# Patient Record
Sex: Male | Born: 1970 | Race: Black or African American | Hispanic: No | Marital: Single | Smoking: Former smoker
Health system: Southern US, Community
[De-identification: ages and names within clinical notes are randomized; demographics above are authoritative.]

---

## 2005-12-29 ENCOUNTER — Emergency Department (HOSPITAL_COMMUNITY): Admission: EM | Admit: 2005-12-29 | Discharge: 2005-12-29 | Payer: Self-pay | Admitting: *Deleted

## 2011-02-18 ENCOUNTER — Emergency Department (HOSPITAL_COMMUNITY)
Admission: EM | Admit: 2011-02-18 | Discharge: 2011-02-18 | Disposition: A | Discharge status: Home / Self Care | Payer: No Typology Code available for payment source | Attending: Emergency Medicine | Admitting: Emergency Medicine

## 2011-02-18 ENCOUNTER — Emergency Department (HOSPITAL_COMMUNITY): Payer: No Typology Code available for payment source

## 2011-02-18 DIAGNOSIS — S8000XA Contusion of unspecified knee, initial encounter: Secondary | ICD-10-CM | POA: Insufficient documentation

## 2011-02-18 DIAGNOSIS — M62838 Other muscle spasm: Secondary | ICD-10-CM | POA: Insufficient documentation

## 2011-02-18 DIAGNOSIS — F17200 Nicotine dependence, unspecified, uncomplicated: Secondary | ICD-10-CM | POA: Insufficient documentation

## 2011-02-18 DIAGNOSIS — M25569 Pain in unspecified knee: Secondary | ICD-10-CM | POA: Insufficient documentation

## 2011-02-18 DIAGNOSIS — S139XXA Sprain of joints and ligaments of unspecified parts of neck, initial encounter: Secondary | ICD-10-CM | POA: Insufficient documentation

## 2011-02-18 DIAGNOSIS — R209 Unspecified disturbances of skin sensation: Secondary | ICD-10-CM | POA: Insufficient documentation

## 2011-02-18 DIAGNOSIS — M542 Cervicalgia: Secondary | ICD-10-CM | POA: Insufficient documentation
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
## 2012-01-05 ENCOUNTER — Ambulatory Visit: Payer: Self-pay | Admitting: Physician Assistant

## 2012-01-05 VITALS — BP 120/78 | HR 61 | Temp 98.7°F | Resp 16 | Ht 73.0 in | Wt 185.0 lb

## 2012-01-05 DIAGNOSIS — Z0289 Encounter for other administrative examinations: Secondary | ICD-10-CM
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
## 2013-10-08 ENCOUNTER — Emergency Department (HOSPITAL_COMMUNITY)
Admission: EM | Admit: 2013-10-08 | Discharge: 2013-10-08 | Disposition: A | Discharge status: Home / Self Care | Payer: 59 | Attending: Emergency Medicine | Admitting: Emergency Medicine

## 2013-10-08 ENCOUNTER — Emergency Department (HOSPITAL_COMMUNITY): Payer: 59

## 2013-10-08 ENCOUNTER — Encounter (HOSPITAL_COMMUNITY): Payer: Self-pay | Admitting: Emergency Medicine

## 2013-10-08 DIAGNOSIS — G8911 Acute pain due to trauma: Secondary | ICD-10-CM | POA: Insufficient documentation

## 2013-10-08 DIAGNOSIS — M25462 Effusion, left knee: Secondary | ICD-10-CM

## 2013-10-08 DIAGNOSIS — M25562 Pain in left knee: Secondary | ICD-10-CM

## 2013-10-08 DIAGNOSIS — M25469 Effusion, unspecified knee: Secondary | ICD-10-CM | POA: Insufficient documentation

## 2013-10-08 DIAGNOSIS — M25569 Pain in unspecified knee: Secondary | ICD-10-CM | POA: Insufficient documentation

## 2013-10-08 DIAGNOSIS — Z87891 Personal history of nicotine dependence: Secondary | ICD-10-CM | POA: Insufficient documentation

## 2013-10-08 IMAGING — CR DG KNEE COMPLETE 4+V*L*
4 series · 4 of 4 positions shown · non-contrast
Comparison: None.

CLINICAL DATA: Knee injury and pain.

EXAM:
LEFT KNEE - COMPLETE 4+ VIEW

[t knee ap left]
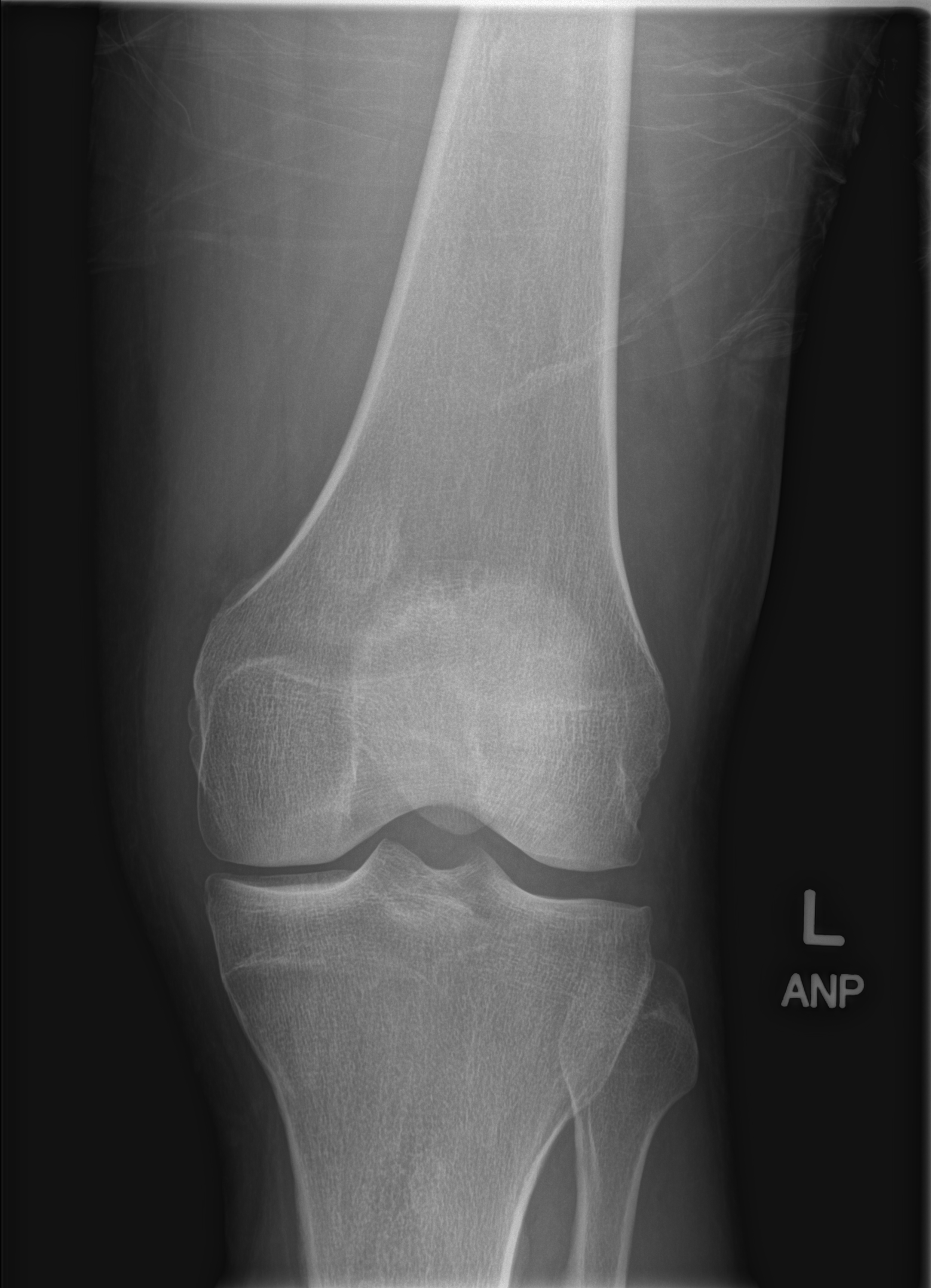

[t knee obl left (1 of 2)]
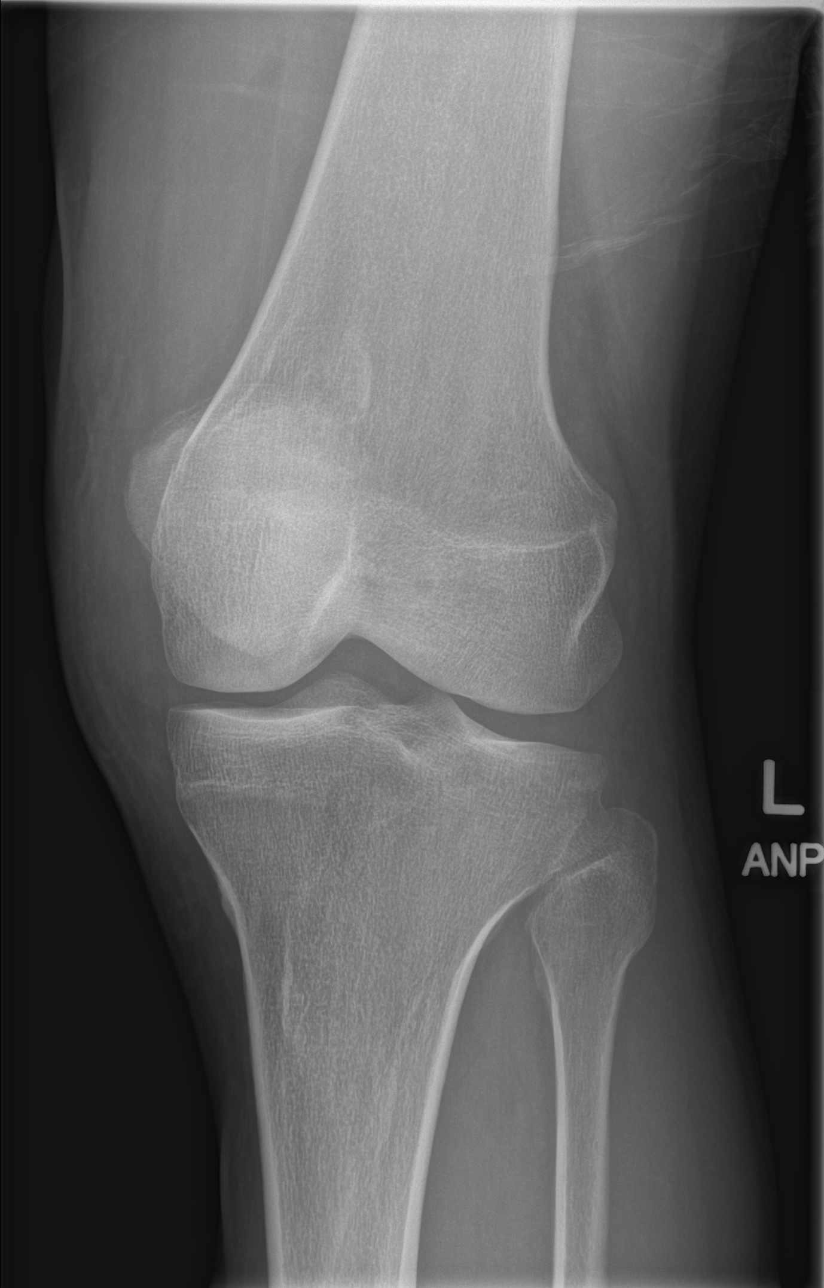

[t knee obl left (2 of 2)]
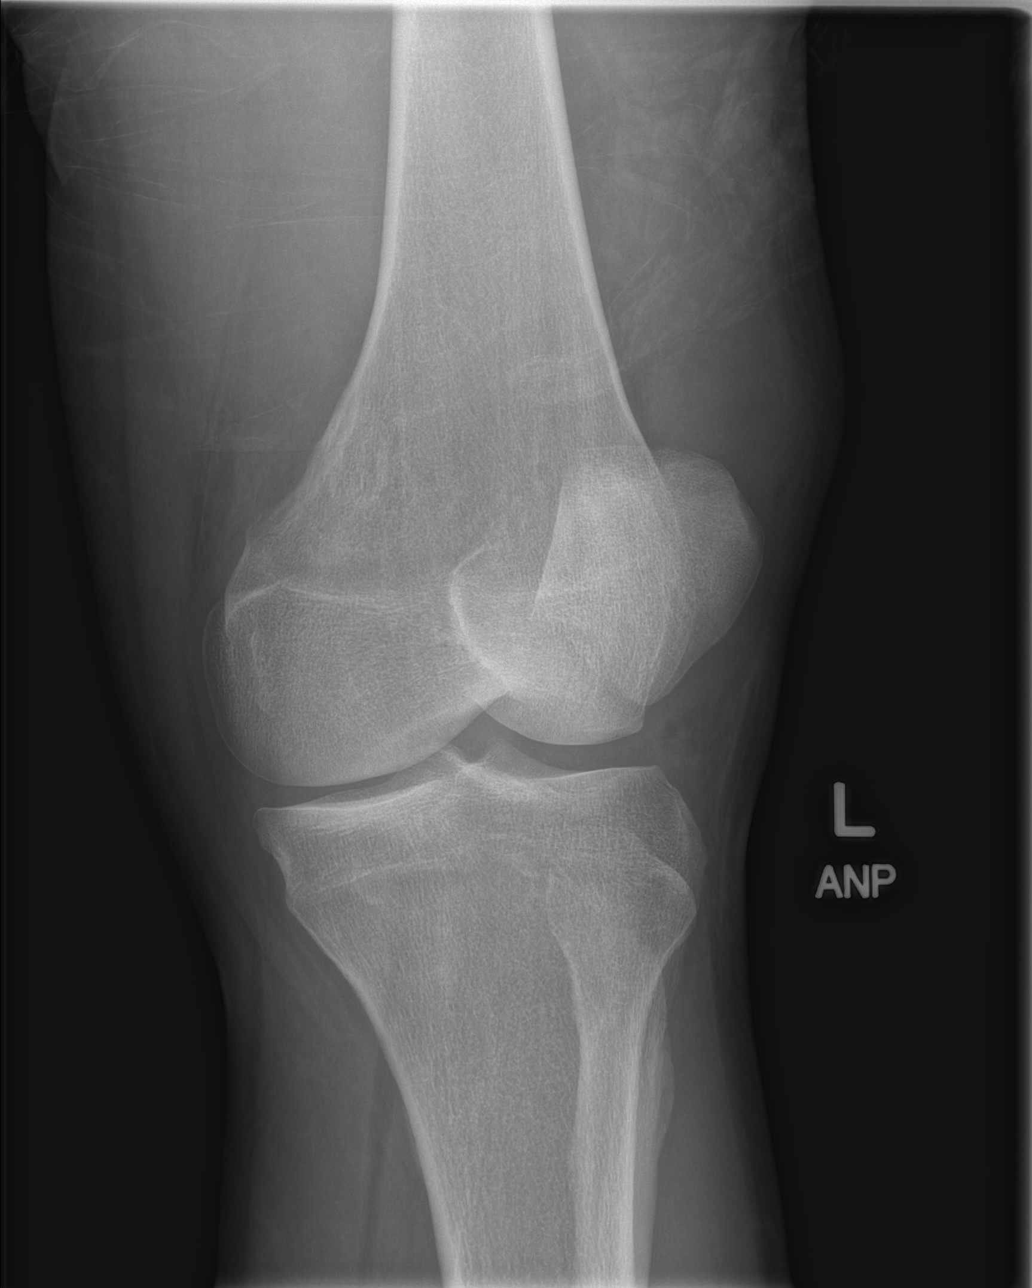

[t knee lat left]
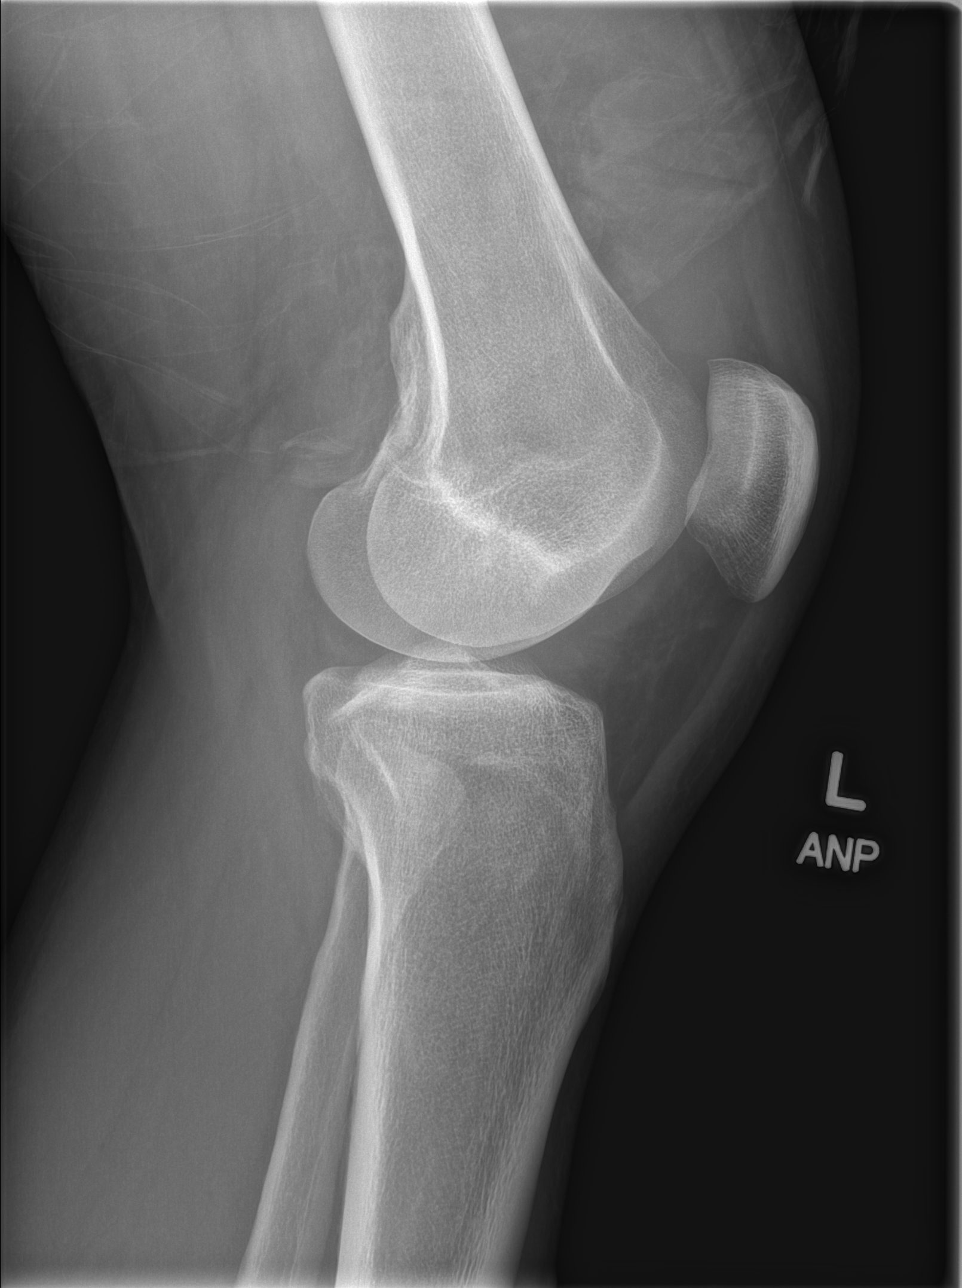

[4 of 4 positions shown; findings below may reference images not displayed]

FINDINGS: A moderate knee joint effusion is seen. No evidence of
lipohemarthrosis. No evidence of fracture or dislocation. No other
significant bone abnormality identified.
IMPRESSION: Moderate knee joint effusion.  No evidence of fracture.

## 2013-10-08 MED ORDER — NAPROXEN 500 MG PO TABS
500.0000 mg | ORAL_TABLET | Freq: Once | ORAL | Status: AC
  Administered 2013-10-08: 500 mg via ORAL
  Filled 2013-10-08: qty 1

## 2013-10-08 MED ORDER — NAPROXEN 500 MG PO TABS: 500.0000 mg | ORAL_TABLET | Freq: Two times a day (BID) | ORAL | Status: DC

## 2013-10-08 MED ORDER — HYDROCODONE-ACETAMINOPHEN 5-325 MG PO TABS: 1.0000 | ORAL_TABLET | ORAL | Status: DC | PRN
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
## 2013-10-08 NOTE — Discharge Instructions (Signed)
We are a knee brace for compression. Use crutches when walking to prevent from bearing weight on her left leg. Recommend Naprosyn as prescribed and Norco as needed for severe pain. Followup with orthopedics. Return if symptoms worsen such as if you develop redness of your knee, and numbness to your leg, fever, or red streaking from your knee up or down your left leg.  RICE: Routine Care for Injuries The routine care of many injuries includes Rest, Ice, Compression, and Elevation (RICE). HOME CARE INSTRUCTIONS  Rest is needed to allow your body to heal. Routine activities can usually be resumed when comfortable. Injured tendons and bones can take up to 6 weeks to heal. Tendons are the cord-like structures that attach muscle to bone.  Ice following an injury helps keep the swelling down and reduces pain.  Put ice in a plastic bag.  Place a towel between your skin and the bag.  Leave the ice on for 15-20 minutes, 03-04 times a day. Do this while awake, for the first 24 to 48 hours. After that, continue as directed by your caregiver.  Compression helps keep swelling down. It also gives support and helps with discomfort. If an elastic bandage has been applied, it should be removed and reapplied every 3 to 4 hours. It should not be applied tightly, but firmly enough to keep swelling down. Watch fingers or toes for swelling, bluish discoloration, coldness, numbness, or excessive pain. If any of these problems occur, remove the bandage and reapply loosely. Contact your caregiver if these problems continue.  Elevation helps reduce swelling and decreases pain. With extremities, such as the arms, hands, legs, and feet, the injured area should be placed near or above the level of the heart, if possible. SEEK IMMEDIATE MEDICAL CARE IF:  You have persistent pain and swelling.  You develop redness, numbness, or unexpected weakness.  Your symptoms are getting worse rather than improving after several  days. These symptoms may indicate that further evaluation or further X-rays are needed. Sometimes, X-rays may not show a small broken bone (fracture) until 1 week or 10 days later. Make a follow-up appointment with your caregiver. Ask when your X-ray results will be ready. Make sure you get your X-ray results. Document Released: 09/21/2000 Document Revised: 09/01/2011 Document Reviewed: 11/08/2010 [facility] Patient Information 2014 [employer], [place].

## 2013-10-08 NOTE — ED Notes (Signed)
Pt states he sprained his L knee on Easter Sunday. States he has taken anti inflammatories and used ice. States now knee is still swollen and he has more pain in it. Pt arrives with knee brace on. Pt ambulatory to exam room with steady gait.

## 2013-10-09 NOTE — ED Provider Notes (Signed)
Medical screening examination/treatment/procedure(s) were performed by non-physician practitioner and as supervising physician I was immediately available for consultation/collaboration.   EKG Interpretation None        Shelda JakesScott W. Jazmaine Fuelling, MD 10/09/13 [phone]

## 2020-06-08 ENCOUNTER — Encounter (HOSPITAL_COMMUNITY): Payer: Self-pay | Admitting: Emergency Medicine

## 2020-06-08 ENCOUNTER — Emergency Department (HOSPITAL_COMMUNITY): Payer: No Typology Code available for payment source

## 2020-06-08 ENCOUNTER — Other Ambulatory Visit: Payer: Self-pay

## 2020-06-08 ENCOUNTER — Emergency Department (HOSPITAL_COMMUNITY)
Admission: EM | Admit: 2020-06-08 | Discharge: 2020-06-09 | Disposition: A | Discharge status: Home / Self Care | Payer: No Typology Code available for payment source | Attending: Emergency Medicine | Admitting: Emergency Medicine

## 2020-06-08 DIAGNOSIS — Z87891 Personal history of nicotine dependence: Secondary | ICD-10-CM | POA: Insufficient documentation

## 2020-06-08 DIAGNOSIS — Y990 Civilian activity done for income or pay: Secondary | ICD-10-CM | POA: Insufficient documentation

## 2020-06-08 DIAGNOSIS — S161XXA Strain of muscle, fascia and tendon at neck level, initial encounter: Secondary | ICD-10-CM | POA: Diagnosis not present

## 2020-06-08 DIAGNOSIS — Y92410 Unspecified street and highway as the place of occurrence of the external cause: Secondary | ICD-10-CM | POA: Insufficient documentation

## 2020-06-08 DIAGNOSIS — S199XXA Unspecified injury of neck, initial encounter: Secondary | ICD-10-CM | POA: Diagnosis present
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
## 2020-06-09 ENCOUNTER — Emergency Department (HOSPITAL_COMMUNITY): Payer: No Typology Code available for payment source
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
## 2020-06-09 MED ORDER — CYCLOBENZAPRINE HCL 10 MG PO TABS: 10.0000 mg | ORAL_TABLET | Freq: Three times a day (TID) | ORAL | 0 refills | Status: DC | PRN

## 2020-06-09 MED ORDER — NAPROXEN 500 MG PO TABS: 500.0000 mg | ORAL_TABLET | Freq: Two times a day (BID) | ORAL | 0 refills | Status: DC

## 2020-07-04 ENCOUNTER — Other Ambulatory Visit: Payer: Self-pay

## 2020-07-04 ENCOUNTER — Emergency Department (HOSPITAL_COMMUNITY)
Admission: EM | Admit: 2020-07-04 | Discharge: 2020-07-04 | Disposition: A | Discharge status: Home / Self Care | Payer: Self-pay | Attending: Emergency Medicine | Admitting: Emergency Medicine

## 2020-07-04 ENCOUNTER — Encounter (HOSPITAL_COMMUNITY): Payer: Self-pay

## 2020-07-04 DIAGNOSIS — Z87891 Personal history of nicotine dependence: Secondary | ICD-10-CM | POA: Insufficient documentation

## 2020-07-04 DIAGNOSIS — M545 Low back pain, unspecified: Secondary | ICD-10-CM | POA: Insufficient documentation

## 2020-07-04 MED ORDER — CYCLOBENZAPRINE HCL 10 MG PO TABS: 10.0000 mg | ORAL_TABLET | Freq: Two times a day (BID) | ORAL | 0 refills | Status: DC | PRN

## 2020-07-04 NOTE — Discharge Instructions (Signed)
You came to the emergency department to be evaluated for your low back pain. Your physical exam was reassuring. At this time there is no need for further imaging however you should follow-up with orthopedics for further evaluation. I have provided information for [facility] orthopedic and sports medicine please contact them for an appointment.    Please read instructions below.  You can take 600 mg of ibuprofen every 6 hours as needed for pain.   Apply ice to your back for 20 minutes at a time.  You can also apply heat if this provides more relief.   You can take Flexeril/cyclobenzaprine every 12 hours as needed for muscle spasm.  Be aware this medication can make you drowsy; do not take while driving or drinking alcohol.   Follow-up with your primary care provider symptoms persist.   Return to ER if new numbness or tingling in your arms or legs, inability to urinate, inability to hold your bowels, or weakness in your extremities.
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
## 2020-07-17 ENCOUNTER — Other Ambulatory Visit: Payer: Self-pay | Admitting: Orthopedic Surgery

## 2020-07-17 DIAGNOSIS — M542 Cervicalgia: Secondary | ICD-10-CM

## 2020-07-17 DIAGNOSIS — M545 Low back pain, unspecified: Secondary | ICD-10-CM

## 2020-08-04 ENCOUNTER — Ambulatory Visit
Admission: RE | Admit: 2020-08-04 | Discharge: 2020-08-04 | Disposition: A | Discharge status: Home / Self Care | Payer: Self-pay | Source: Ambulatory Visit | Attending: Orthopedic Surgery | Admitting: Orthopedic Surgery

## 2020-08-04 ENCOUNTER — Other Ambulatory Visit: Payer: Self-pay

## 2020-08-04 DIAGNOSIS — M542 Cervicalgia: Secondary | ICD-10-CM

## 2020-08-04 DIAGNOSIS — M545 Low back pain, unspecified: Secondary | ICD-10-CM

## 2020-08-04 IMAGING — MR MR CERVICAL SPINE W/O CM
5 series · 32 of 48 positions shown · non-contrast
Comparison: Radiographs of the cervical spine [DATE].

CLINICAL DATA: Cervicalgia. Additional history provided: Patient
reports right shoulder pain since [DATE]st, no known injury, no
prior surgeries.

EXAM:
MRI CERVICAL SPINE WITHOUT CONTRAST
TECHNIQUE: Multiplanar, multisequence MR imaging of the cervical spine was
performed. No intravenous contrast was administered.

[Series 5: T2 · sagittal · 3.0mm · 0.82mm/px · 7 of 15 slices shown (1 of 2)]
[im 1/15]
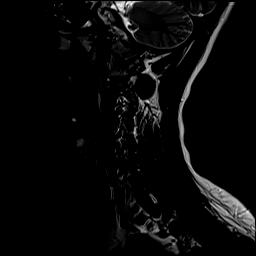
[im 3/15]
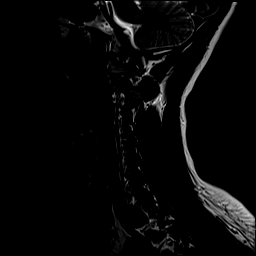
[im 5/15]
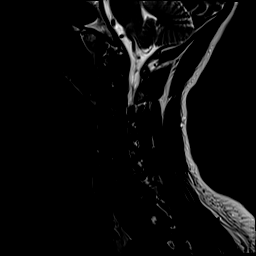
[im 8/15]
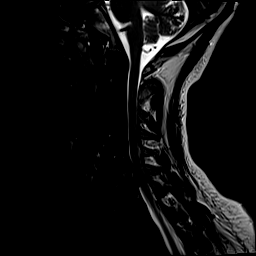
[im 10/15]
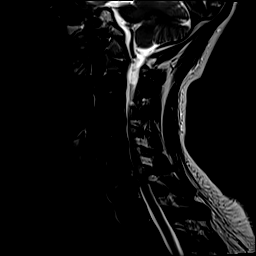
[im 12/15]
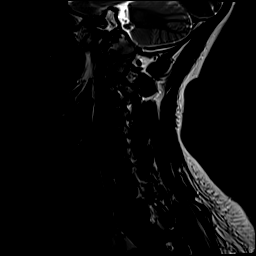
[im 15/15]
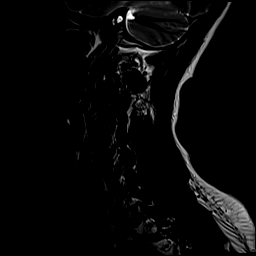

[Series 6: T1 · sagittal · 3.0mm · 0.82mm/px · 7 of 15 slices shown]
[im 1/15]
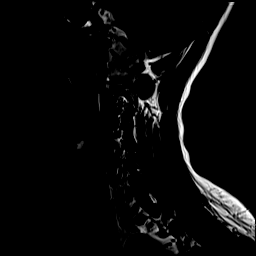
[im 3/15]
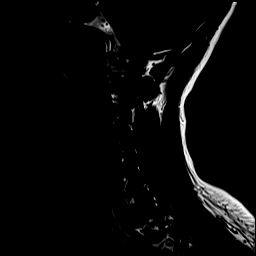
[im 5/15]
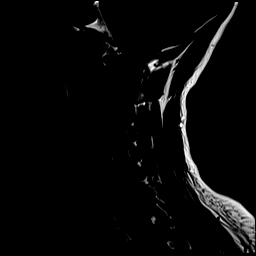
[im 8/15]
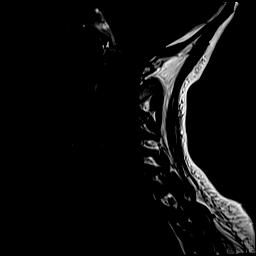
[im 10/15]
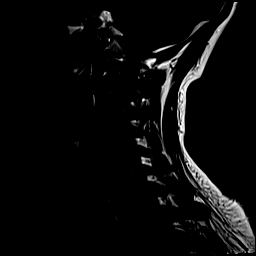
[im 12/15]
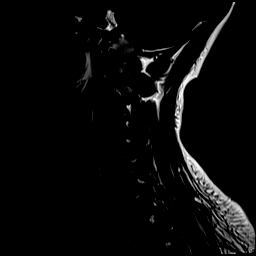
[im 15/15]
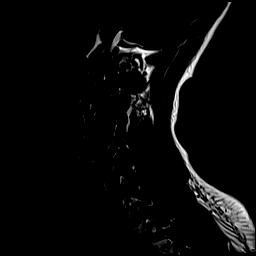

[Series 7: STIR · sagittal · 3.0mm · 0.33mm/px · 6 of 15 slices shown]
[im 1/15]
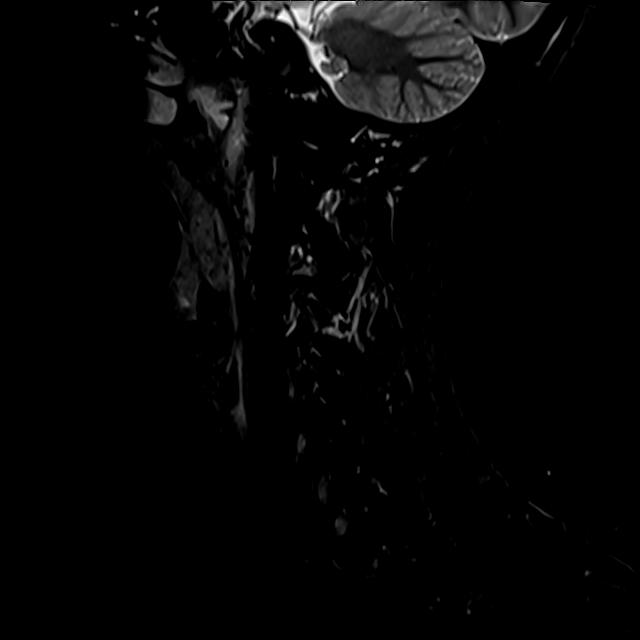
[im 3/15]
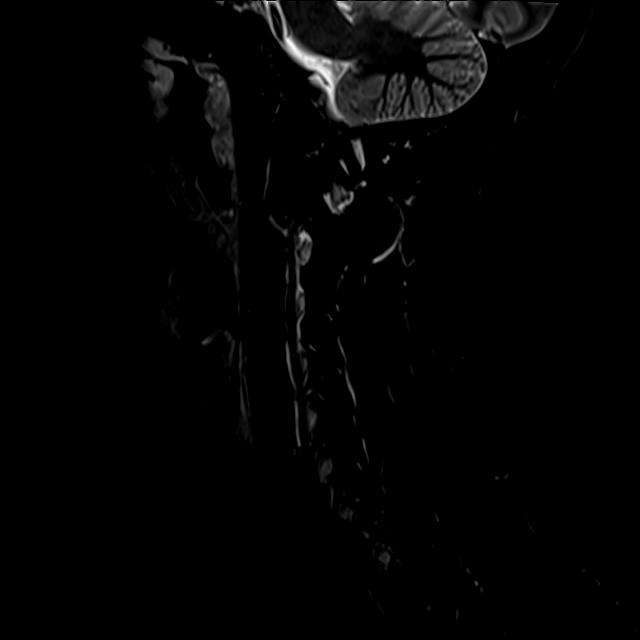
[im 6/15]
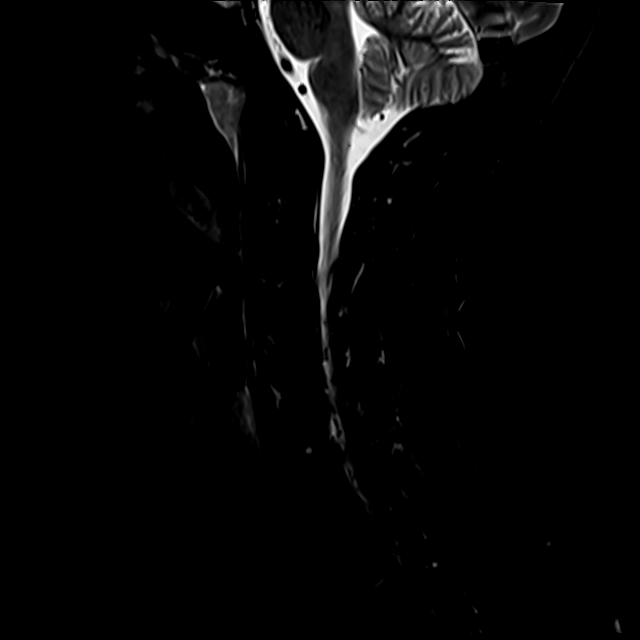
[im 9/15]
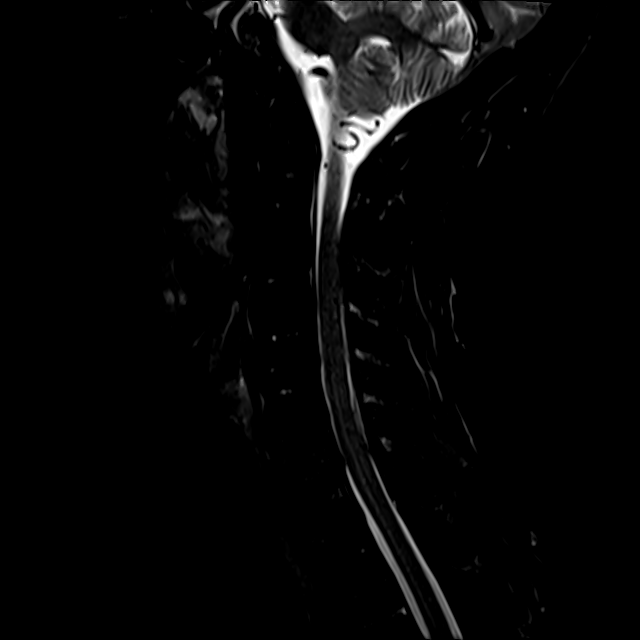
[im 12/15]
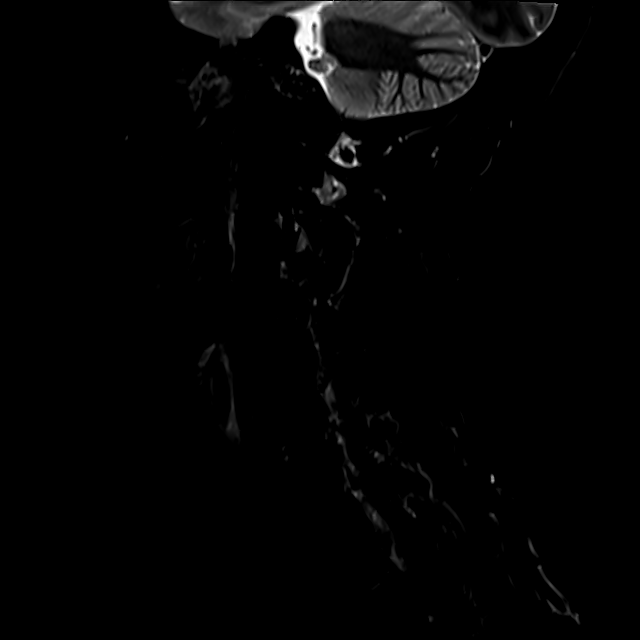
[im 15/15]
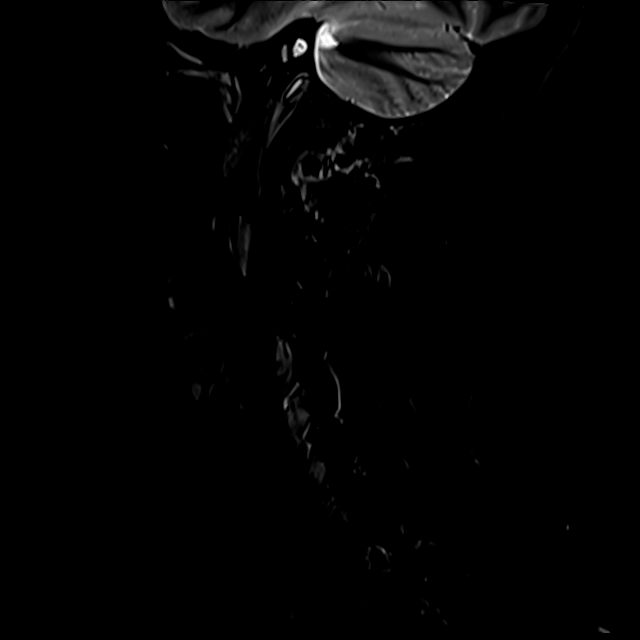

[Series 8: T2 · axial · 3.0mm · 0.62mm/px · z∈[-35,+72]mm · 8 of 34 slices shown (2 of 2)]
[im 1/34]
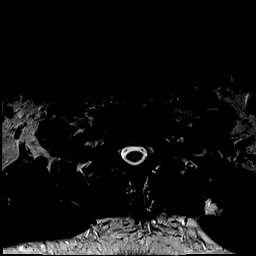
[im 6/34]
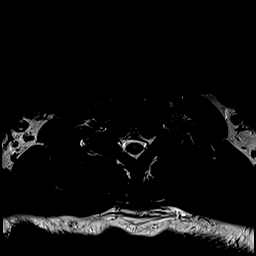
[im 11/34]
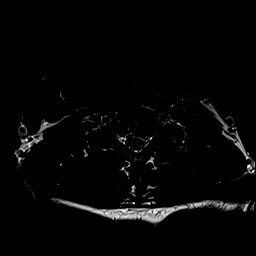
[im 16/34]
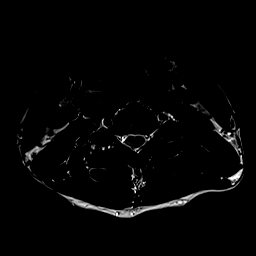
[im 18/34]
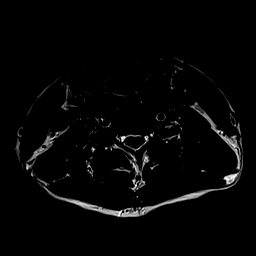
[im 23/34]
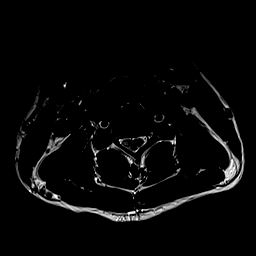
[im 28/34]
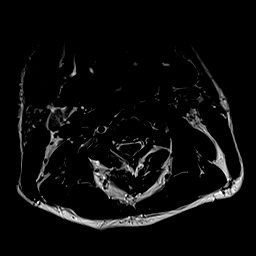
[im 34/34]
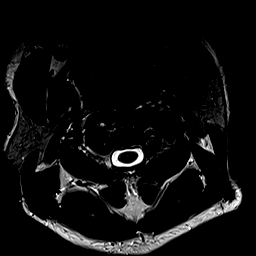

[Series 9: GRE · axial · 3.0mm · 0.42mm/px · z∈[-35,+14]mm · 4 of 34 slices shown]
[im 1/34]
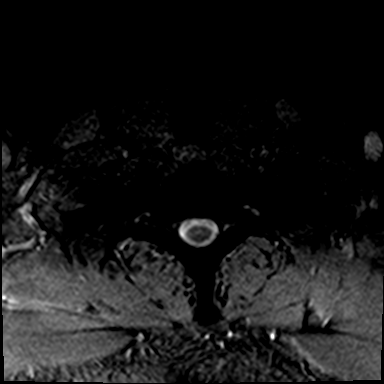
[im 6/34]
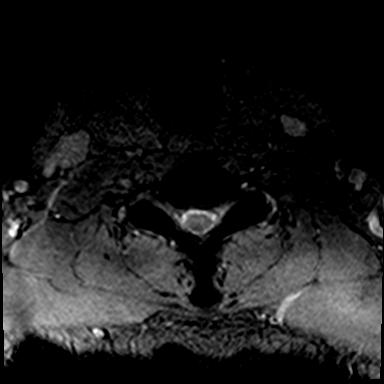
[im 11/34]
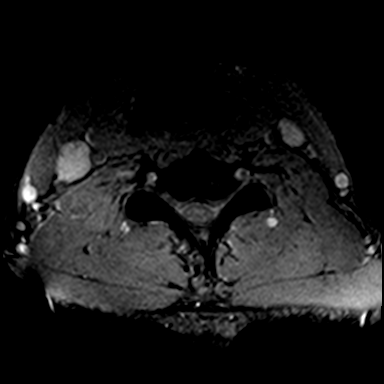
[im 16/34]
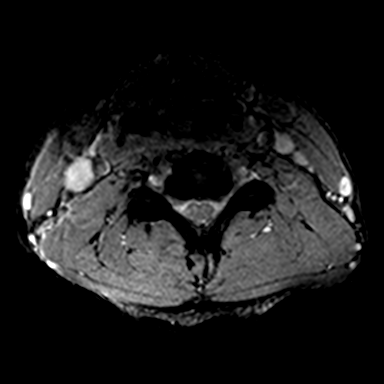

[32 of 48 positions shown; findings below may reference images not displayed]

FINDINGS: Alignment: The expected cervical lordosis. No significant
spondylolisthesis.

Vertebrae: Vertebral body height is maintained. Mild-to-moderate
edema within the C6 and C7 vertebral bodies, likely degenerative in
the absence of any signs or symptoms of infection.

Cord: Nonspecific 3 mm T2 hyperintense focus within the right aspect
of the spinal cord at the C4 level (series 8, image 12) (series 7,
image 7). No other cervical spinal cord signal abnormality is
identified.

Posterior Fossa, vertebral arteries, paraspinal tissues: . flow
voids preserved within the imaged cervical vertebral arteries.
Paraspinal soft tissues within normal limits.

Disc levels:

Mild/moderate C6-C7 disc degeneration. No more than mild disc
degeneration at the remaining levels.

C2-C3: Shallow disc bulge. Uncovertebral hypertrophy. No significant
degenerative canal stenosis. Mild right neural foraminal narrowing.

C3-C4: Shallow disc bulge. Uncovertebral hypertrophy. No significant
degenerative canal stenosis. Moderate/severe bilateral neural
foraminal narrowing.

C4-C5: Shallow disc bulge. Uncovertebral hypertrophy. Mild relative
spinal canal narrowing. Bilateral neural foraminal narrowing (severe
right, moderate left).

C5-C6: Shallow disc bulge. Uncovertebral hypertrophy. Mild relative
spinal canal narrowing. Moderate/severe bilateral neural foraminal
narrowing.

C6-C7: Disc bulge asymmetric to the left. Bilateral disc osteophyte
ridge/uncinate hypertrophy. Mild ligamentum flavum hypertrophy.
Moderate spinal canal stenosis with contact upon the dorsal and
ventral spinal cord. Bilateral neural foraminal narrowing
(moderate/severe right, severe left).

C7-T1: Shallow disc bulge. Endplate spurring. Mild facet
hypertrophy. No significant spinal canal stenosis. Mild bilateral
neural foraminal narrowing (greater on the left).

These results will be called to the ordering clinician or
representative by the Radiologist Assistant, and communication
documented in the PACS or [REDACTED].
IMPRESSION: Nonspecific 3 mm T2 hyperintense lesion within the right aspect of
the spinal cord at the C4 level. Postcontrast MR imaging of the
cervical spine is recommended for further characterization.

Mild to moderate edema within the C6 and C7 vertebrae, likely
degenerative in the absence of any signs or symptoms of infection.
Clinical correlation is recommended.

Cervical spondylosis superimposed upon a congenitally narrow
cervical spinal canal as outlined. Moderate C6-C7 spinal canal
stenosis with contact upon the dorsal and ventral spinal cord. No
more than mild acquired spinal canal stenosis at the remaining
levels.

Multilevel neural foraminal narrowing greatest bilaterally at C3-C4
(moderate/severe), on the right at C4-C5 (severe), bilaterally at
C5-C6 (moderate/severe) and bilaterally at C6-C7 (moderate/severe
right, severe left).

## 2020-08-04 IMAGING — MR MR LUMBAR SPINE W/O CM
4 of 5 series · 20 of 48 positions shown · non-contrast
Comparison: No pertinent prior exams available for comparison.

CLINICAL DATA: Low back pain, unspecified back pain laterality,
unspecified chronicity, unspecified whether sciatica present.
Additional history provided by scanning technologist: Patient
reports low back pain since [DATE].

EXAM:
MRI LUMBAR SPINE WITHOUT CONTRAST
TECHNIQUE: Multiplanar, multisequence MR imaging of the lumbar spine was
performed. No intravenous contrast was administered.

[Series 5: T2 · sagittal · 4.0mm · 0.73mm/px · 6 of 15 slices shown (1 of 2)]
[im 1/15]
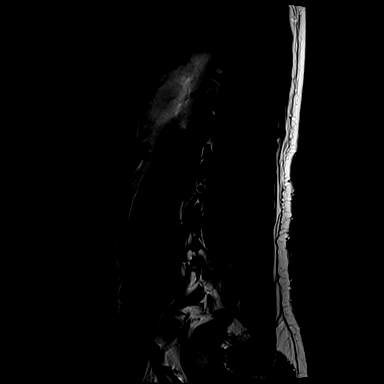
[im 3/15]
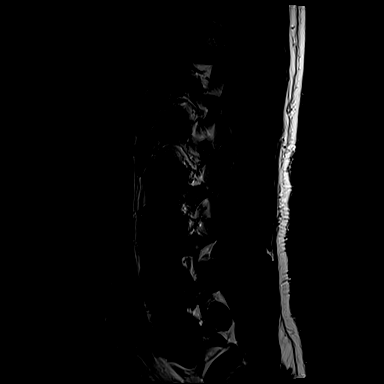
[im 6/15]
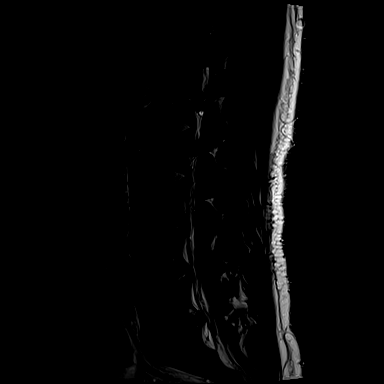
[im 9/15]
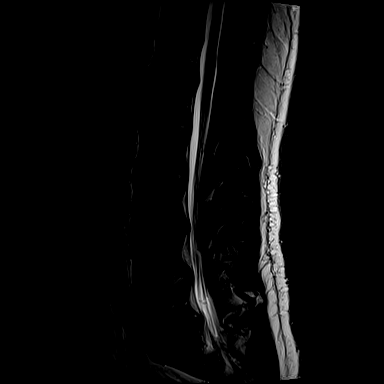
[im 12/15]
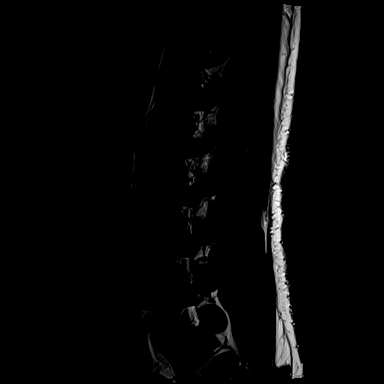
[im 15/15]
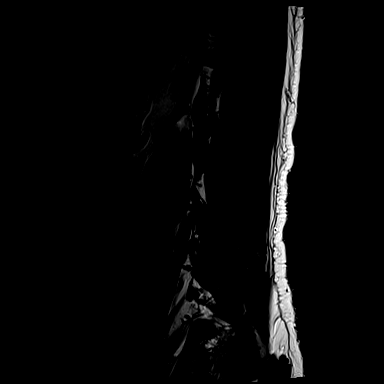

[Series 6: T1 · sagittal · 4.0mm · 1.09mm/px · 3 of 15 slices shown (1 of 2)]
[im 1/15]
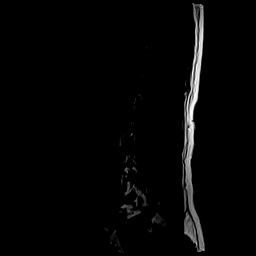
[im 8/15]
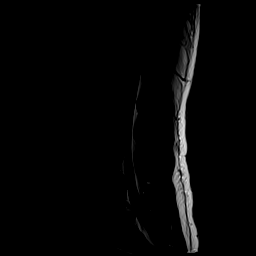
[im 15/15]
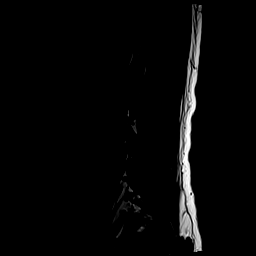

[Series 10: T1 · axial · 4.0mm · 0.35mm/px · z∈[-8,+159]mm · 3 of 44 slices shown (2 of 2)]
[im 6/44]
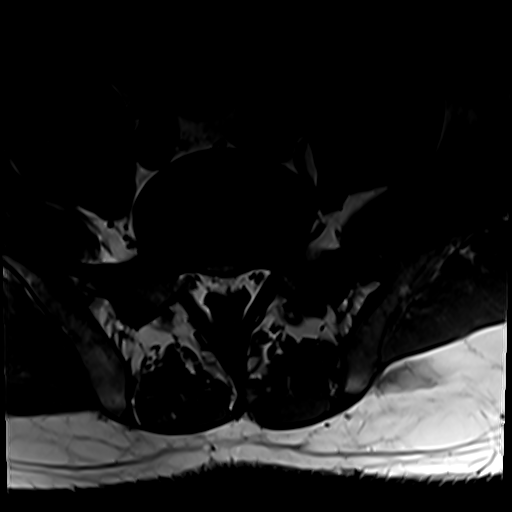
[im 23/44]
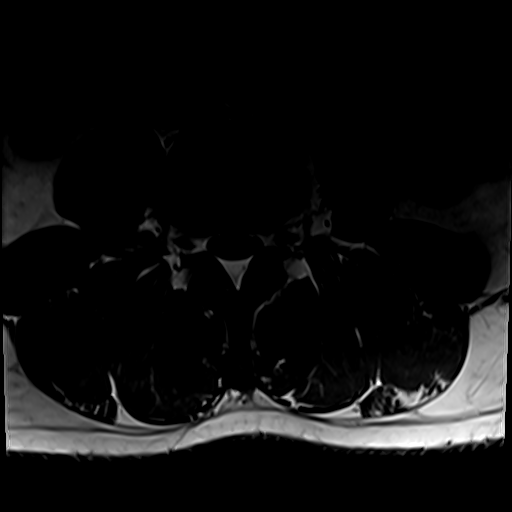
[im 38/44]
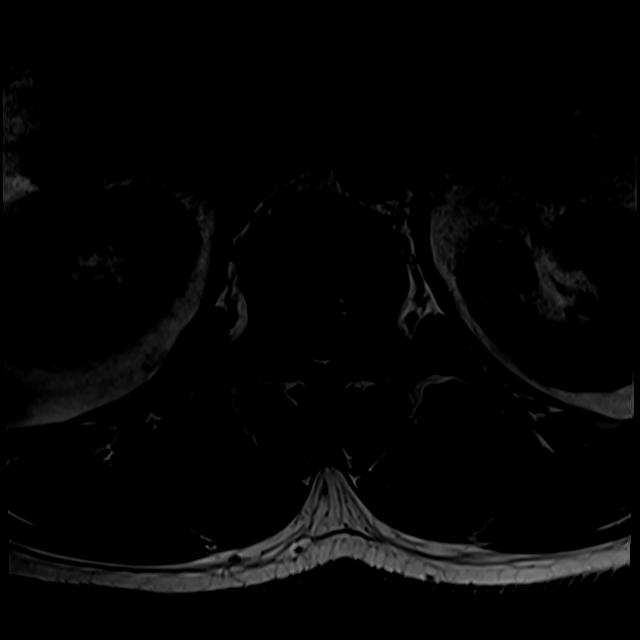

[Series 13: T2 · axial · 4.0mm · 0.35mm/px · z∈[-23,+159]mm · 8 of 44 slices shown (2 of 2)]
[im 3/44]
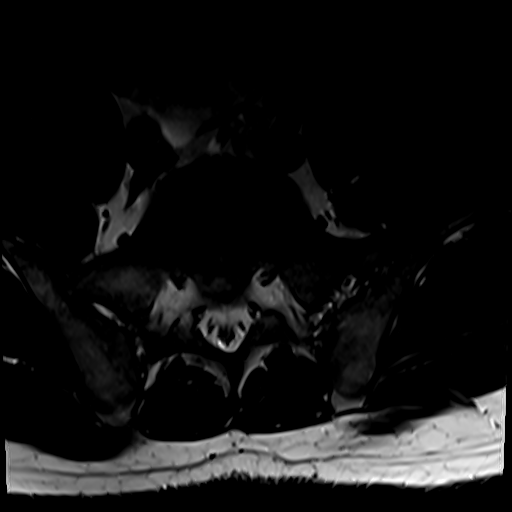
[im 6/44]
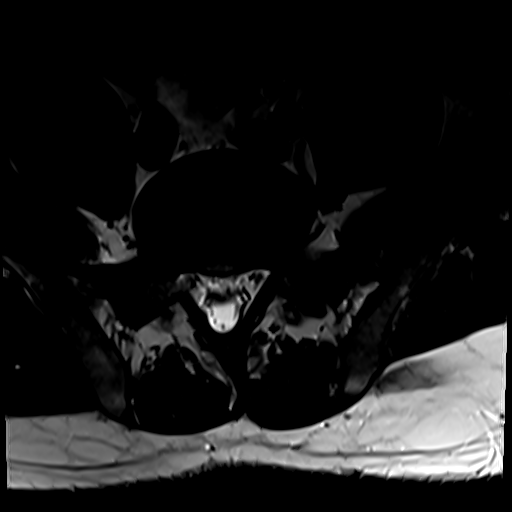
[im 9/44]
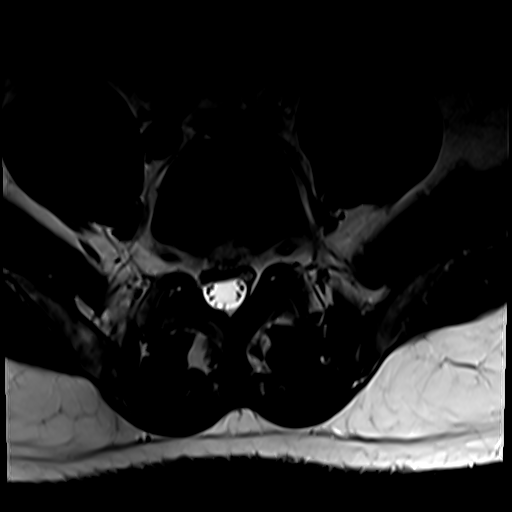
[im 15/44]
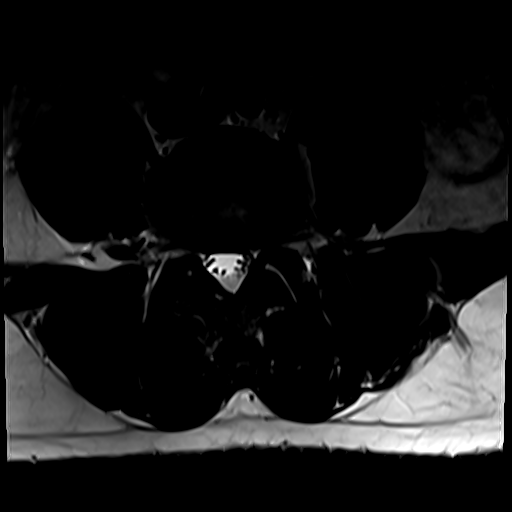
[im 21/44]
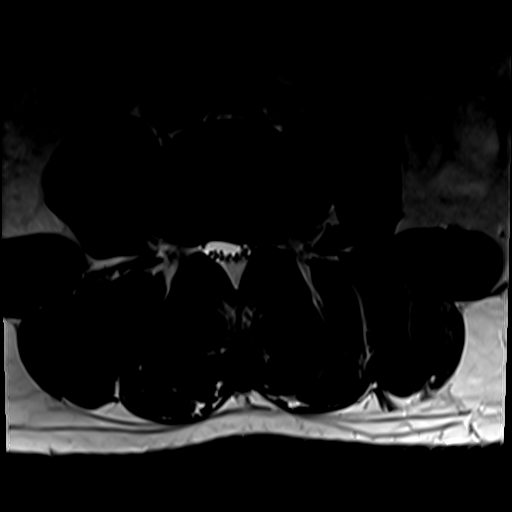
[im 23/44]
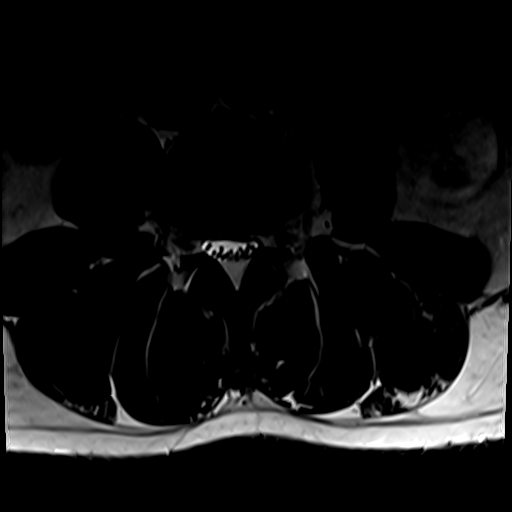
[im 26/44]
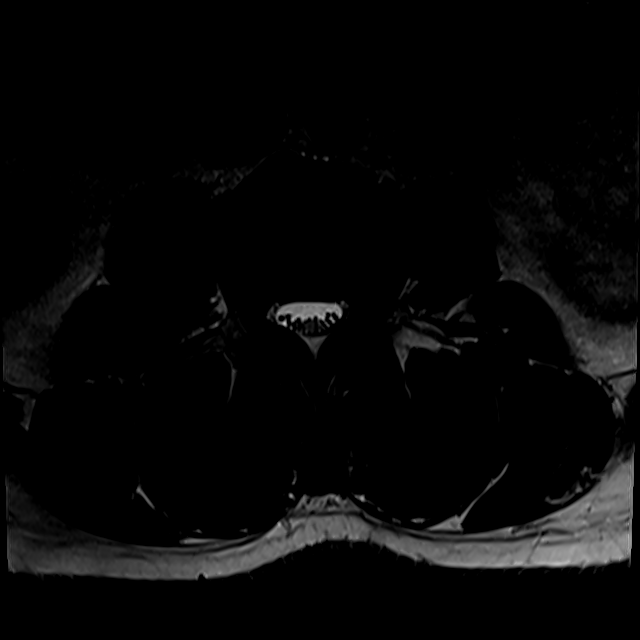
[im 38/44]
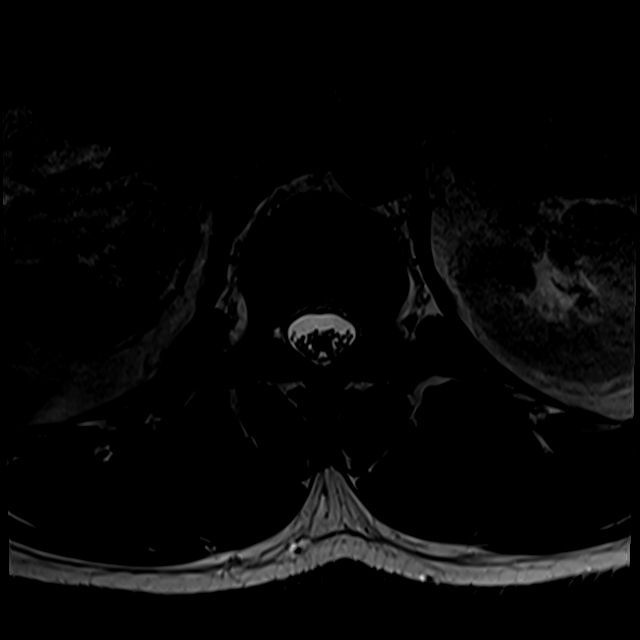

[20 of 48 positions shown; findings below may reference images not displayed]

FINDINGS: Segmentation: For the purposes of this dictation, five lumbar
vertebrae are assumed and the caudal most well-formed intervertebral
disc is designated L5-S1.

Alignment: Lumbar dextrocurvature. No significant spondylolisthesis.

Vertebrae: Vertebral body height is maintained. Mild scattered edema
within the posterior elements, likely degenerative. No significant
marrow edema identified elsewhere. No focal suspicious osseous
lesion.

Conus medullaris and cauda equina: Conus extends to the L2 level. No
signal abnormality within the visualized distal spinal cord

Paraspinal and other soft tissues: Incompletely imaged and
incompletely assessed 13 mm T2 hyperintense lesion within the right
hepatic lobe (series 13, image 1). No other abnormality is
identified within included portions of the abdomen/retroperitoneum.
Paraspinal soft tissues within normal limits

Disc levels:

Mild disc degeneration at T11-T12, T12-L1 and L5-S1.

Congenitally narrow lumbar spinal canal.

T11-T12: Imaged sagittally. Minimal disc bulge. No significant
degenerative spinal canal or foraminal stenosis.

T12-L1: Mild disc bulge. Superimposed small central disc protrusion.
Mild relative spinal canal narrowing without spinal cord mass
effect. No significant foraminal stenosis.

L1-L2: No significant disc herniation or degenerative stenosis.

L2-L3: No significant disc herniation or degenerative stenosis.

L3-L4: Disc bulge with endplate spurring. Superimposed broad-based
left center to left foraminal disc protrusion. Moderate left
subarticular narrowing with encroachment upon descending left-sided
nerve roots, most notably the descending left L4 nerve root (series
13, image 24). Mild relative narrowing of the central canal. Mild
left neural foraminal narrowing.

L4-L5: Disc bulge with endplate spurring. Superimposed left
subarticular to left foraminal disc protrusion at site of posterior
annular fissure. Mild facet arthrosis. The disc protrusion
contributes to mild/moderate left subarticular narrowing, crowding
the descending left L5 nerve root. Central canal patent. The disc
protrusion also contributes to mild/moderate left neural foraminal
narrowing.

L5-S1: Disc bulge with endplate spurring. Superimposed broad-based
central disc protrusion eccentric to the right. The disc protrusion
contributes to mild right subarticular narrowing with slight
crowding of the descending right S1 nerve root (series 13, image
37). Central canal patent. No significant foraminal stenosis.
IMPRESSION: Lumbar spondylosis superimposed upon a congenitally narrow lumbar
spinal canal, as outlined and with findings most notably as follows.

At L3-L4, a broad-based left center to left foraminal disc
protrusion contributes to mild left subarticular narrowing,
encroaching upon descending left-sided nerve roots (most notably the
descending left L4 nerve root). The disc protrusion also contributes
to mild relative central canal narrowing and mild left neural
foraminal narrowing.

At L4-L5, a left subarticular to left foraminal disc protrusion
contributes to mild/moderate left subarticular narrowing with
crowding of the descending left L5 nerve root. It also contributes
to mild/moderate left neural foraminal narrowing.

At L5-S1, a broad-based central disc protrusion eccentric to the
right contributes to mild right subarticular narrowing with slight
crowding of the descending right S1 nerve root.

Lumbar levocurvature.

Incompletely imaged and incompletely assessed T2 hyperintense lesion
within the right hepatic lobe. This may reflect a cyst but remains
indeterminate. Right upper quadrant ultrasound recommended for
initial further evaluation.

## 2020-08-10 ENCOUNTER — Other Ambulatory Visit: Payer: Self-pay | Admitting: Orthopedic Surgery

## 2020-08-10 DIAGNOSIS — M542 Cervicalgia: Secondary | ICD-10-CM

## 2021-04-10 ENCOUNTER — Ambulatory Visit (INDEPENDENT_AMBULATORY_CARE_PROVIDER_SITE_OTHER): Payer: Self-pay | Admitting: Family

## 2021-04-10 ENCOUNTER — Encounter: Payer: Self-pay | Admitting: Family

## 2021-04-10 ENCOUNTER — Other Ambulatory Visit: Payer: Self-pay

## 2021-04-10 VITALS — BP 128/80 | HR 65 | Temp 98.5°F | Ht 73.0 in | Wt 214.8 lb

## 2021-04-10 DIAGNOSIS — R600 Localized edema: Secondary | ICD-10-CM

## 2021-04-10 DIAGNOSIS — K769 Liver disease, unspecified: Secondary | ICD-10-CM | POA: Insufficient documentation

## 2021-04-10 DIAGNOSIS — Z7689 Persons encountering health services in other specified circumstances: Secondary | ICD-10-CM | POA: Insufficient documentation

## 2021-04-10 DIAGNOSIS — M4306 Spondylolysis, lumbar region: Secondary | ICD-10-CM | POA: Insufficient documentation

## 2021-04-10 NOTE — Assessment & Plan Note (Signed)
Reviewed 07/2020 ER notes and MRI scan. Discussed getting U/S in near future to further assess.

## 2021-04-10 NOTE — Patient Instructions (Addendum)
Welcome to [facility] at [facility]! It was a pleasure meeting you today.  Please see more information regarding a low sodium diet. This will help you avoid having swelling in different areas of your body.  Drink at least 64oz = 2 liters = 8 cups of water every day. Be sure and stop to stretch and walk around every 2 hours when driving on long trips to avoid cramping and possible leg clots. Also may want to wear mild compression socks to help with circulation while driving long distances. Wear during the day and take off at night while sleeping.  As discussed, please let us know through MyChart when you are needing refills, or have your pharmacy send [place] the request. You can use MyChart to communicate with me or any staff. Please schedule follow up appointment for a physical with fasting labs at your convenience, and we can see you sooner for any concerns if needed.

## 2021-04-10 NOTE — Assessment & Plan Note (Addendum)
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 Discussed in length that this is related to the sodium as well as having his legs in a dependent position for long periods. Pt reports eating a healthier diet in general as he was told he had a fatty liver and a cyst on his liver, so he avoids fast food, eats more veges, protein, less dairy. Advised on eating low sodium or no salt added seeds & overall diet, drink at least 64oz water daily, and to take breaks when driving to stretch, also try mild compression socks when driving.

## 2021-04-10 NOTE — Progress Notes (Signed)
Subjective:     Patient ID: Ronald Lin, male    DOB: March 26, 1971, 50 y.o.   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 He has began to notice numbness and swelling 2 weeks ago.     HPI:  Edema: Patient complains of edema. The location of the edema is knee(s) left, ankle(s) left.  The edema has been localized.  Onset of symptoms was 6 months ago, stable since that time. The edema is present intermittently and in the evening. The patient states the problem is intermittent after he eats a lot of sunflower seeds. The swelling has been aggravated by dependency of involved area, flying or long car trips, and increased salt intake, relieved by elevation of involved area, and been associated with nothing. Cardiac risk factors include male gender and sedentary lifestyle.     Health Maintenance Due  Topic Date Due   HIV Screening  Never done   Hepatitis C Screening  Never done   TETANUS/TDAP  Never done   COLONOSCOPY (Pts 45-69yrs Insurance coverage will need to be confirmed)  Never done   COVID-19 Vaccine (3 - Booster for Pfizer series) 03/13/2020   Zoster Vaccines- Shingrix (1 of 2) Never done    History reviewed. No pertinent past medical history.  History reviewed. No pertinent surgical history.  Outpatient Medications Prior to Visit  Medication Sig Dispense Refill   cyclobenzaprine (FLEXERIL) 10 MG tablet Take 1 tablet (10 mg total) by mouth every 12 (twelve) hours as needed for muscle spasms. (Patient not taking: Reported on 04/10/2021) 20 tablet 0   naproxen (NAPROSYN) 500 MG tablet Take 1 tablet (500 mg total) by mouth 2 (two) times daily with a meal. (Patient not taking: Reported on 04/10/2021) 30 tablet 0   No facility-administered medications prior to visit.    No Known Allergies      Objective:    Physical Exam Vitals and nursing note reviewed.  Constitutional:       General: He is not in acute distress.    Appearance: Normal appearance.  HENT:     Head: Normocephalic.  Cardiovascular:     Rate and Rhythm: Normal rate and regular rhythm.     Pulses:          Dorsalis pedis pulses are 2+ on the right side and 2+ on the left side.  Pulmonary:     Effort: Pulmonary effort is normal.     Breath sounds: Normal breath sounds.  Musculoskeletal:        General: Normal range of motion.     Cervical back: Normal range of motion.     Right lower leg: No edema.     Left lower leg: No edema.  Skin:    General: Skin is warm and dry.  Neurological:     Mental Status: He is alert and oriented to person, place, and time.  Psychiatric:        Mood and Affect: Mood normal.    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 Discussed that this is related to the sodium as well as having his legs in a dependent position for long periods. Pt reports eating a healthier diet in general as he was told he had a fatty liver and a cyst on his liver, so he avoids fast food, eats more veges, protein, less dairy. Advised on eating low sodium or no salt added seeds, drink at least 64oz water daily, and to take breaks when driving to stretch, also try mild compression socks when driving.      Liver lesion, right lobe   Encounter to establish care with new doctor - Primary    I am having Ronald Lin maintain his naproxen and cyclobenzaprine.  No orders of the defined types were  placed in this encounter.

## 2021-04-10 NOTE — Assessment & Plan Note (Addendum)
Reviewed 07/2020 ER notes and MRI scan.

## 2021-05-15 ENCOUNTER — Encounter: Payer: PRIVATE HEALTH INSURANCE | Admitting: Family

## 2021-05-24 ENCOUNTER — Encounter: Payer: PRIVATE HEALTH INSURANCE | Admitting: Family

## 2022-03-17 ENCOUNTER — Encounter: Payer: Self-pay | Admitting: *Deleted

## 2022-06-05 ENCOUNTER — Encounter: Payer: Self-pay | Admitting: *Deleted

## 2022-06-17 ENCOUNTER — Ambulatory Visit: Payer: PRIVATE HEALTH INSURANCE | Admitting: Family

## 2022-06-17 ENCOUNTER — Ambulatory Visit (INDEPENDENT_AMBULATORY_CARE_PROVIDER_SITE_OTHER): Payer: PRIVATE HEALTH INSURANCE | Admitting: Family

## 2022-06-17 VITALS — BP 122/79 | HR 62 | Temp 97.3°F | Ht 73.0 in | Wt 203.4 lb

## 2022-06-17 DIAGNOSIS — Z1211 Encounter for screening for malignant neoplasm of colon: Secondary | ICD-10-CM

## 2022-06-17 DIAGNOSIS — Z0000 Encounter for general adult medical examination without abnormal findings: Secondary | ICD-10-CM

## 2022-06-17 LAB — COMPREHENSIVE METABOLIC PANEL
ALT: 19 U/L (ref 0–53)
AST: 16 U/L (ref 0–37)
Albumin: 4.2 g/dL (ref 3.5–5.2)
Alkaline Phosphatase: 82 U/L (ref 39–117)
BUN: 13 mg/dL (ref 6–23)
CO2: 28 mEq/L (ref 19–32)
Calcium: 9.4 mg/dL (ref 8.4–10.5)
Chloride: 108 mEq/L (ref 96–112)
Creatinine, Ser: 1.29 mg/dL (ref 0.40–1.50)
GFR: 63.98 mL/min (ref >60.00)
Glucose, Bld: 66 mg/dL — ABNORMAL LOW (ref 70–99)
Potassium: 4.2 mEq/L (ref 3.5–5.1)
Sodium: 143 mEq/L (ref 135–145)
Total Bilirubin: 0.9 mg/dL (ref 0.2–1.2)
Total Protein: 6.6 g/dL (ref 6.0–8.3)

## 2022-06-17 LAB — CBC WITH DIFFERENTIAL/PLATELET
Basophils Absolute: 0 10*3/uL (ref 0.0–0.1)
Basophils Relative: 0.6 % (ref 0.0–3.0)
Eosinophils Absolute: 0.1 10*3/uL (ref 0.0–0.7)
Eosinophils Relative: 1.8 % (ref 0.0–5.0)
HCT: 47.2 % (ref 39.0–52.0)
Hemoglobin: 15.8 g/dL (ref 13.0–17.0)
Lymphocytes Relative: 28.7 % (ref 12.0–46.0)
Lymphs Abs: 1.5 10*3/uL (ref 0.7–4.0)
MCHC: 33.5 g/dL (ref 30.0–36.0)
MCV: 89.3 fl (ref 78.0–100.0)
Monocytes Absolute: 0.5 10*3/uL (ref 0.1–1.0)
Monocytes Relative: 10.1 % (ref 3.0–12.0)
Neutro Abs: 3.1 10*3/uL (ref 1.4–7.7)
Neutrophils Relative %: 58.8 % (ref 43.0–77.0)
Platelets: 176 10*3/uL (ref 150.0–400.0)
RBC: 5.28 Mil/uL (ref 4.22–5.81)
RDW: 13.4 % (ref 11.5–15.5)
WBC: 5.3 10*3/uL (ref 4.0–10.5)

## 2022-06-17 LAB — LIPID PANEL
Cholesterol: 101 mg/dL (ref 0–200)
HDL: 43.3 mg/dL (ref >39.00)
LDL Cholesterol: 39 mg/dL (ref 0–99)
NonHDL: 57.82
Total CHOL/HDL Ratio: 2
Triglycerides: 95 mg/dL (ref 0.0–149.0)
VLDL: 19 mg/dL (ref 0.0–40.0)

## 2022-06-17 LAB — TSH: TSH: 0.94 u[IU]/mL (ref 0.35–5.50)

## 2022-06-17 NOTE — Progress Notes (Signed)
Phone: [phone]  Subjective:  Patient 51 y.o. male presenting for annual physical.  Chief Complaint  Patient presents with   Annual Exam    Not fasting w/ Labs - ate a banana    See problem oriented charting- ROS- full  review of systems was completed and negative    The following were reviewed and entered/updated in epic: No past medical history on file. 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  No family history on file.  Medications- reviewed and updated No current outpatient medications on file.   No current facility-administered medications for this visit.    Allergies-reviewed and updated No Known Allergies  Social History   Social History Narrative   Not on file    Objective:  BP 122/79 (BP Location: Left Arm, Patient Position: Sitting, Cuff Size: Large)   Pulse 62   Temp (!) 97.3 F (36.3 C) (Temporal)   Ht 6\' 1"  (1.854 m)   Wt 203 lb 6 oz (92.3 kg)   SpO2 95%   BMI 26.83 kg/m  Physical Exam Vitals and nursing note reviewed.  Constitutional:      General: He is not in acute distress.    Appearance: Normal appearance.  HENT:     Head: Normocephalic.     Right Ear: Tympanic membrane and external ear normal.     Left Ear: Tympanic membrane and external ear normal.     Nose: Nose normal.     Mouth/Throat:     Mouth: Mucous membranes are moist.  Eyes:     Extraocular Movements: Extraocular movements intact.  Cardiovascular:     Rate and Rhythm: Normal rate and regular rhythm.  Pulmonary:     Effort: Pulmonary effort is normal.     Breath sounds: Normal breath sounds.  Abdominal:     General: Abdomen is flat. There is no distension.     Palpations: Abdomen is soft.     Tenderness: There is no abdominal tenderness.  Musculoskeletal:         General: Normal range of motion.     Cervical back: Normal range of motion.  Skin:    General: Skin is warm and dry.  Neurological:     Mental Status: He is alert and oriented to person, place, and time.  Psychiatric:        Mood and Affect: Mood normal.        Behavior: Behavior normal.        Judgment: Judgment normal.      Assessment and Plan   Health Maintenance counseling: 1. Anticipatory guidance: Patient counseled regarding regular dental exams q6 months, eye exams yearly, avoiding smoking and second hand smoke, limiting alcohol to 2 beverages per day.   2. Risk factor reduction:  Advised patient of need for regular exercise and diet rich in fruits and vegetables to reduce risk of heart attack and stroke. Exercise- most days.   Wt Readings from Last 3 Encounters:  06/17/22 203 lb 6 oz (92.3 kg)  04/10/21 214 lb 12.8 oz (97.4 kg)  01/05/12 185 lb (83.9 kg)   3. Immunizations/screenings/ancillary studies Immunization History  Administered Date(s) Administered   PFIZER(Purple Top)SARS-COV-2 Vaccination 08/27/2019, 10/12/2019   Health Maintenance Due  Topic Date Due   HIV Screening  Never done  Hepatitis C Screening  Never done   DTaP/Tdap/Td (1 - Tdap) Never done    4. Prostate cancer screening >55yo - risk factors? no risk factors 5. Colon cancer screening: ordering today 6. Skin cancer screening-  advised regular sunscreen use. Denies worrisome, changing, or new skin lesions.  7. Smoking associated screening (lung cancer screening, AAA screen 65-75, UA)- non- smoker 8. STD screening - N/A 9. Alcohol screening: rarely  Problem List Items Addressed This Visit   None Visit Diagnoses     Colon cancer screening    -  Primary   Relevant Orders   Ambulatory referral to Gastroenterology   Annual physical exam    - pt has lost 9lbs since last year, reports eating healthier, cut back on fried foods, eating more veges, fruits, smoothies.    Relevant Orders    Comprehensive metabolic panel   TSH   Lipid panel   CBC with Differential/Platelet   Recommended follow up:  Return for any future concerns. No future appointments.   Lab/Order associations:  non-fasting    Jeanie Sewer, NP

## 2022-06-17 NOTE — Patient Instructions (Signed)
It was very nice to see you today!    I will review your lab results via MyChart in a few days.  I have also sent a referral for you Colonoscopy.  Keep up the good work with eating healthier!  Have a wonderful new year!       PLEASE NOTE:  If you had any lab tests please let us know if you have not heard back within a few days. You may see your results on MyChart before we have a chance to review them but we will give you a call once they are reviewed by [place]. If we ordered any referrals today, please let us know if you have not heard from their office within the next week.

## 2022-06-20 NOTE — Progress Notes (Signed)
All of your labs are within normal range! Glucose (sugar), electrolytes, kidney and liver function, blood count, thyroid, and cholesterol numbers are all good.   Keep up the good work with controlling your diet and continue to try and shoot for 30 minutes of exercise daily!
